# Patient Record
Sex: Male | Born: 1965 | Race: White | Hispanic: No | Marital: Single | Smoking: Never smoker
Health system: Southern US, Community
[De-identification: ages and names within clinical notes are randomized; demographics above are authoritative.]

## PROBLEM LIST (undated history)

## (undated) DIAGNOSIS — I10 Essential (primary) hypertension: Secondary | ICD-10-CM

## (undated) HISTORY — PX: APPENDECTOMY: SHX54

## (undated) HISTORY — PX: BACK SURGERY: SHX140

---

## 2004-08-09 ENCOUNTER — Encounter (INDEPENDENT_AMBULATORY_CARE_PROVIDER_SITE_OTHER): Payer: Self-pay | Admitting: *Deleted

## 2004-08-09 ENCOUNTER — Observation Stay (HOSPITAL_COMMUNITY): Admission: RE | Admit: 2004-08-09 | Discharge: 2004-08-10 | Payer: Self-pay | Admitting: Orthopedic Surgery

## 2008-10-01 ENCOUNTER — Ambulatory Visit (HOSPITAL_COMMUNITY): Admission: RE | Admit: 2008-10-01 | Discharge: 2008-10-01 | Payer: Self-pay | Admitting: Orthopedic Surgery

## 2009-02-14 ENCOUNTER — Ambulatory Visit: Payer: Self-pay | Admitting: Occupational Medicine

## 2009-02-14 DIAGNOSIS — S838X9A Sprain of other specified parts of unspecified knee, initial encounter: Secondary | ICD-10-CM | POA: Insufficient documentation

## 2009-02-14 DIAGNOSIS — S86819A Strain of other muscle(s) and tendon(s) at lower leg level, unspecified leg, initial encounter: Secondary | ICD-10-CM | POA: Insufficient documentation

## 2009-07-28 ENCOUNTER — Ambulatory Visit: Payer: Self-pay | Admitting: Family Medicine

## 2009-07-28 LAB — CONVERTED CEMR LAB: Rapid Strep: NEGATIVE

## 2010-03-28 NOTE — Assessment & Plan Note (Signed)
Summary: Possible strep throat   Vital Signs:  Patient Profile:   45 Years Old Male CC:      sore throat intermittent X 3 weeks, severe X today Height:     74 inches Weight:      268 pounds O2 Sat:      95 % O2 treatment:    Room Air Temp:     98.7 degrees F oral Pulse rate:   110 / minute Pulse rhythm:   regular Resp:     16 per minute BP sitting:   161 / 95  (right arm) Cuff size:   large  Pt. in pain?   yes    Location:   throat    Type:       sore  Vitals Entered By: Lajean Saver RN (July 28, 2009 3:05 PM)                   Updated Prior Medication List: TOPROL XL 25 MG XR24H-TAB (METOPROLOL SUCCINATE) once daily LISINOPRIL 20 MG TABS (LISINOPRIL) once daily  Current Allergies (reviewed today): ! PENICILLIN G POTASSIUM (PENICILLIN G POTASSIUM) ! CODEINE PHOSPHATE (CODEINE PHOSPHATE)History of Present Illness Chief Complaint: sore throat intermittent X 3 weeks, severe X today History of Present Illness: AS ABOVE. BOTH OF HIS CHILDREM POS FOR STREP. NO FEVER, NO RUNNY NOSE OR COUGH. NO SELF TX.   REVIEW OF SYSTEMS Constitutional Symptoms      Denies fever, chills, night sweats, weight loss, weight gain, and fatigue.  Eyes       Denies change in vision, eye pain, eye discharge, glasses, contact lenses, and eye surgery. Ear/Nose/Throat/Mouth       Complains of sore throat.      Denies hearing loss/aids, change in hearing, ear pain, ear discharge, dizziness, frequent runny nose, frequent nose bleeds, sinus problems, hoarseness, and tooth pain or bleeding.  Respiratory       Denies dry cough, productive cough, wheezing, shortness of breath, asthma, bronchitis, and emphysema/COPD.  Cardiovascular       Denies murmurs, chest pain, and tires easily with exhertion.    Gastrointestinal       Denies stomach pain, nausea/vomiting, diarrhea, constipation, blood in bowel movements, and indigestion. Genitourniary       Denies painful urination, kidney stones, and loss of  urinary control. Neurological       Denies paralysis, seizures, and fainting/blackouts. Musculoskeletal       Denies muscle pain, joint pain, joint stiffness, decreased range of motion, redness, swelling, muscle weakness, and gout.  Skin       Denies bruising, unusual mles/lumps or sores, and hair/skin or nail changes.  Psych       Denies mood changes, temper/anger issues, anxiety/stress, speech problems, depression, and sleep problems.  Past History:  Past Medical History: Reviewed history from 02/14/2009 and no changes required. htn since age 10  Past Surgical History: lumbar Sx Appendectomy  Family History: Reviewed history from 02/14/2009 and no changes required. mom-breast CA dad died of cancer-unknown age 79  Social History: Never Smoked Alcohol use-no Drug use-no Smoking Status:  never Drug Use:  no Physical Exam General appearance: well developed, well nourished, no acute distress Ears: normal, no lesions or deformities Nasal: mucosa pink, nonedematous, no septal deviation, turbinates normal Oral/Pharynx: RED AND SWOLLEN WITH WHITE EXUDATES.  Neck: neck supple,  trachea midline, no masses Chest/Lungs: no rales, wheezes, or rhonchi bilateral, breath sounds equal without effort Heart: regular rate and  rhythm, no murmur  Skin: no obvious rashes or lesions Assessment New Problems: PHARYNGITIS, ACUTE (ICD-462.0)   Plan New Medications/Changes: ERYTHROMYCIN BASE 333 MG TBEC (ERYTHROMYCIN BASE) 1 TID  ##30 x 0, 07/28/2009, Marvis Moeller DO  New Orders: Est. Patient Level III [57846]   Prescriptions: ERYTHROMYCIN BASE 333 MG TBEC (ERYTHROMYCIN BASE) 1 TID  ##30 x 0   Entered and Authorized by:   Marvis Moeller DO   Signed by:   Marvis Moeller DO on 07/28/2009   Method used:   Print then Give to Patient   RxID:   [id number]   Patient Instructions: 1)  TYLENOL OR MOTRIN AS NEEDED. AVOID CAFFEINE AND MILK PRODUCTS. THROAT SPRAY OF CHOICE. FOLLOW UP  WITH YOUR PCP OR RETURN IF SYMPTOMS PERSIST OR WORSEN.   Orders Added: 1)  Est. Patient Level III [99213]  Laboratory Results  Date/Time Received: July 28, 2009 3:33 PM  Date/Time Reported: July 28, 2009 3:33 PM   Other Tests  Rapid Strep: negative  Kit Test Internal QC: Negative   (Normal Range: Negative)

## 2010-07-14 NOTE — Op Note (Signed)
NAME:  Juan Rojas, Juan Rojas NO.:  [id number]   MEDICAL RECORD NO.:  [id number]          PATIENT TYPE:  OBV   LOCATION:  1621                         FACILITY:  [facility]   PHYSICIAN:  Georges Lynch. Gioffre, M.D.DATE OF BIRTH:  02-17-1966   DATE OF PROCEDURE:  08/09/2004  DATE OF DISCHARGE:                                 OPERATIVE REPORT   SURGEON:  Georges Lynch. Darrelyn Hillock, M.D.   ASSISTANT:  Marlowe Kays, M.D.   PREOPERATIVE DIAGNOSIS:  Large herniated disk on the left at L5-6.  He had  six lumbar vertebrae.  The herniated disk was the next-to-last open disk  space.  This was verified looking with the radiologist looking at the plain  films as well as the MRI.   POSTOPERATIVE DIAGNOSIS:  Large herniated disk on the left at L5-6.  He had  six lumbar vertebrae.  The herniated disk was the next-to-last open disk  space.  This was verified looking with the radiologist looking at the plain  films as well as the MRI.   OPERATION:  Hemilaminectomy and microdiskectomy at L5-6 on the left.   PROCEDURE:  Under general anesthesia with the patient on the spinal frame,  he first had 1 gm of IV Ancef.  Routine orthopedic prepping and draping of  the lower back was carried out.  Two needles were placed in the back for  localization purposes and an x-ray was taken.  Once we evaluated the proper  space, an incision was made over the L5-6 interspace.  Bleeders were  identified and cauterized.  The muscle was stripped away from the lamina and  spinous process in the usual fashion.  Another x-ray was taken to verify the  position.  We then inserted the [facility] retractors.  I then carried out a  hemilaminectomy in the usual fashion at L5-6.  The ligamentum flavum was  identified and removed as well.  Great care was taken not to injure the  underlying dura or nerve root.  Following this, we gently identified the  root, gently retracted the root, cauterized the lateral recess veins with a  bipolar, identified a large extruded herniated disk.  A cruciate incision  was made in the posterior longitudinal ligament.  As soon as that was done,  the disk material literally extruded like a volcano from the disk space.  We  then went down into the disk space and removed the remaining disks.  We went  subligamentous proximally, distally, medially and laterally as well to make  sure there were no other loose fragments.  There were none.  We were then  easily able to pass a hockey stick out the foramen.  The 5 root was now  free.  We thoroughly irrigated the area.  Good hemostasis was maintained.  I  loosely applied some thrombin-soaked Gelfoam to the area.  The deep part of  the wound was closed in the usual fashion except a small portion was left  open proximally just for drainage purposes.  The remaining part of the wound  was closed in the usual fashion.  The skin was closed  with metal staples.  A  sterile Neosporin dressing was applied.  The patient had 30 mg of IV Toradol  before leaving the operating room.   NOTE:  He had a partial foot drop preop on the left.     RAG/MEDQ  D:  08/09/2004  T:  08/09/2004  Job:  [id number]

## 2010-07-25 ENCOUNTER — Encounter: Payer: Self-pay | Admitting: Emergency Medicine

## 2010-07-25 ENCOUNTER — Inpatient Hospital Stay (INDEPENDENT_AMBULATORY_CARE_PROVIDER_SITE_OTHER)
Admission: RE | Admit: 2010-07-25 | Discharge: 2010-07-25 | Disposition: A | Discharge status: Home / Self Care | Payer: BC Managed Care – PPO | Source: Ambulatory Visit | Attending: Emergency Medicine | Admitting: Emergency Medicine

## 2010-07-25 DIAGNOSIS — J069 Acute upper respiratory infection, unspecified: Secondary | ICD-10-CM

## 2010-07-25 LAB — CONVERTED CEMR LAB: Rapid Strep: NEGATIVE

## 2010-07-27 ENCOUNTER — Telehealth (INDEPENDENT_AMBULATORY_CARE_PROVIDER_SITE_OTHER): Payer: Self-pay | Admitting: *Deleted

## 2011-01-29 NOTE — Progress Notes (Signed)
Summary: SORE THROAT AND HEADACHES   Vital Signs:  Patient Profile:   45 Years Old Male CC:      sore throat and HA, body aches x 3 days  Height:     74 inches Weight:      98.8 pounds O2 Sat:      97 % O2 treatment:    Room Air Temp:     98.8 degrees F oral Pulse rate:   78 / minute Resp:     16 per minute BP sitting:   130 / 80  (left arm) Cuff size:   large  Vitals Entered By: Lajean Saver RN (Jul 25, 2010 8:45 AM)                  Updated Prior Medication List: TOPROL XL 25 MG XR24H-TAB (METOPROLOL SUCCINATE) once daily LISINOPRIL 20 MG TABS (LISINOPRIL) once daily  Current Allergies (reviewed today): ! PENICILLIN G POTASSIUM (PENICILLIN G POTASSIUM) ! CODEINE PHOSPHATE (CODEINE PHOSPHATE)History of Present Illness History from: patient Chief Complaint: sore throat and HA, body aches x 3 days  History of Present Illness: 45 Years Old Male complains of onset of cold symptoms for 3 days.  Juan Rojas has been using no OTC meds. +sore throat No cough No pleuritic pain No wheezing No nasal congestion No post-nasal drainage No sinus pain/pressure No chest congestion No itchy/red eyes No earache No hemoptysis No SOB +chills/sweats No fever No nausea No vomiting No abdominal pain No diarrhea No skin rashes No fatigue No myalgias + headache   REVIEW OF SYSTEMS Constitutional Symptoms      Denies fever, chills, night sweats, weight loss, weight gain, and fatigue.      Comments: body aches Eyes       Denies change in vision, eye pain, eye discharge, glasses, contact lenses, and eye surgery. Ear/Nose/Throat/Mouth       Complains of sore throat.      Denies hearing loss/aids, change in hearing, ear pain, ear discharge, dizziness, frequent runny nose, frequent nose bleeds, sinus problems, hoarseness, and tooth pain or bleeding.  Respiratory       Denies dry cough, productive cough, wheezing, shortness of breath, asthma, bronchitis, and emphysema/COPD.    Cardiovascular       Denies murmurs, chest pain, and tires easily with exhertion.    Gastrointestinal       Denies stomach pain, nausea/vomiting, diarrhea, constipation, blood in bowel movements, and indigestion. Genitourniary       Denies painful urination, kidney stones, and loss of urinary control. Neurological       Complains of headaches.      Denies paralysis, seizures, and fainting/blackouts. Musculoskeletal       Denies muscle pain, joint pain, joint stiffness, decreased range of motion, redness, swelling, muscle weakness, and gout.  Skin       Denies bruising, unusual mles/lumps or sores, and hair/skin or nail changes.  Psych       Denies mood changes, temper/anger issues, anxiety/stress, speech problems, depression, and sleep problems. Other Comments: pt c/o sore throat, body aches and HA x 3 days   Past History:  Past Medical History: Reviewed history from 02/14/2009 and no changes required. htn since age 35  Past Surgical History: Reviewed history from 07/28/2009 and no changes required. lumbar Sx Appendectomy  Family History: Reviewed history from 02/14/2009 and no changes required. mom-breast CA dad died of cancer-unknown age 39  Social History: Never Smoked Alcohol use-yes Drug use-no Physical Exam General appearance: well  developed, well nourished, no acute distress Ears: normal, no lesions or deformities Nasal: mucosa pink, nonedematous, no septal deviation, turbinates normal Oral/Pharynx: pharyngeal erythema without exudate, uvula midline without deviation Chest/Lungs: no rales, wheezes, or rhonchi bilateral, breath sounds equal without effort Heart: regular rate and  rhythm, no murmur MSE: oriented to time, place, and person Assessment New Problems: UPPER RESPIRATORY INFECTION, ACUTE (ICD-465.9)   Plan New Medications/Changes: ZITHROMAX Z-PAK 250 MG TABS (AZITHROMYCIN) use as directed  #1 x 0, 07/25/2010, Hoyt Koch MD  New  Orders: Est. Patient Level III [29528] Rapid Strep [87880] T-Culture, Throat [[id number]] Planning Comments:   1)  Take the prescribed antibiotic as instructed.  Hold for a few days until the culture returns.  Rapid strep negative. 2)  Use nasal saline solution (over the counter) at least 3 times a day. 3)  Use over the counter decongestants like Zyrtec-D every 12 hours as needed to help with congestion. 4)  Can take tylenol every 6 hours or motrin every 8 hours for pain or fever. 5)  Follow up with your primary doctor  if no improvement in 5-7 days, sooner if increasing pain, fever, or new symptoms.    The patient and/or caregiver has been counseled thoroughly with regard to medications prescribed including dosage, schedule, interactions, rationale for use, and possible side effects and they verbalize understanding.  Diagnoses and expected course of recovery discussed and will return if not improved as expected or if the condition worsens. Patient and/or caregiver verbalized understanding.  Prescriptions: ZITHROMAX Z-PAK 250 MG TABS (AZITHROMYCIN) use as directed  #1 x 0   Entered and Authorized by:   Hoyt Koch MD   Signed by:   Hoyt Koch MD on 07/25/2010   Method used:   Print then Give to Patient   RxID:   [id number]   Orders Added: 1)  Est. Patient Level III [56387] 2)  Rapid Strep [56433] 3)  T-Culture, Throat [[id number]]    Laboratory Results  Date/Time Received: Jul 25, 2010 8:46 AM  Date/Time Reported: Jul 25, 2010 8:47 AM   Other Tests  Rapid Strep: negative  Kit Test Internal QC: Negative   (Normal Range: Negative)

## 2011-01-29 NOTE — Telephone Encounter (Signed)
  Phone Note Outgoing Call Call back at Home Phone [phone] P PH     Call placed by: Lajean Saver RN,  Jul 27, 2010 2:40 PM Call placed to: Patient Action Taken: Phone Call Completed Summary of Call: Callback: Patient not available. Spoke with wife. She reports he is better with the exception of his throat. Negative throat culture given. Wife reported he started the antibiotic last night. Advised to finish the series

## 2011-06-14 ENCOUNTER — Emergency Department (INDEPENDENT_AMBULATORY_CARE_PROVIDER_SITE_OTHER)
Admission: EM | Admit: 2011-06-14 | Discharge: 2011-06-14 | Disposition: A | Discharge status: Home / Self Care | Payer: BC Managed Care – PPO | Source: Home / Self Care | Attending: Emergency Medicine | Admitting: Emergency Medicine

## 2011-06-14 ENCOUNTER — Encounter: Payer: Self-pay | Admitting: *Deleted

## 2011-06-14 DIAGNOSIS — J029 Acute pharyngitis, unspecified: Secondary | ICD-10-CM

## 2011-06-14 HISTORY — DX: Essential (primary) hypertension: I10

## 2011-06-14 MED ORDER — CLARITHROMYCIN 500 MG PO TABS: 500.0000 mg | ORAL_TABLET | Freq: Two times a day (BID) | ORAL | Status: AC

## 2011-06-14 NOTE — ED Provider Notes (Signed)
History     CSN: [id number]  Arrival date & time 06/14/11  1147   First MD Initiated Contact with Patient 06/14/11 1214      Chief Complaint  Patient presents with  . Sore Throat    (Consider location/radiation/quality/duration/timing/severity/associated sxs/prior treatment) HPI Juan Rojas is a 46 y.o. male who complains of onset of cold symptoms for 4 weeks.  The symptoms are constant and mild in severity.  He states that his children have had sore throats and has been making him sick as well.  He states that he does get sore throats a lot but the strep test was negative, so this time he waited as long as he could but it seems to be getting worse after a month now.  He states that he has no allergic rhinitis or GERD.   + sore throat No cough No pleuritic pain No wheezing No nasal congestion No post-nasal drainage No sinus pain/pressure No chest congestion No itchy/red eyes No earache No hemoptysis No SOB No chills/sweats No fever No nausea No vomiting No abdominal pain No diarrhea No skin rashes + fatigue No myalgias No headache    Past Medical History  Diagnosis Date  . Hypertension     Past Surgical History  Procedure Date  . Appendectomy   . Back surgery     Family History  Problem Relation Age of Onset  . Cancer Mother   . Cancer Father     History  Substance Use Topics  . Smoking status: Never Smoker   . Smokeless tobacco: Not on file  . Alcohol Use: No      Review of Systems  All other systems reviewed and are negative.    Allergies  Codeine phosphate and Penicillins  Home Medications   Current Outpatient Rx  Name Route Sig Dispense Refill  . LISINOPRIL 10 MG PO TABS Oral Take 10 mg by mouth daily.    Marland Kitchen METOPROLOL SUCCINATE ER 25 MG PO TB24 Oral Take 25 mg by mouth daily.    Marland Kitchen CLARITHROMYCIN 500 MG PO TABS Oral Take 1 tablet (500 mg total) by mouth 2 (two) times daily. 14 tablet 0    BP 125/80  Pulse 84  Temp(Src) 98.6 F (37  C) (Oral)  Resp 18  Ht 6' 1.5" (1.867 m)  Wt 269 lb (122.018 kg)  BMI 35.01 kg/m2  SpO2 97%  Physical Exam  Nursing note and vitals reviewed. Constitutional: He is oriented to person, place, and time. He appears well-developed and well-nourished.  HENT:  Head: Normocephalic and atraumatic.  Right Ear: Tympanic membrane, external ear and ear canal normal.  Left Ear: Tympanic membrane, external ear and ear canal normal.  Nose: Nose normal.  Mouth/Throat: Uvula is midline. Uvula swelling present. Posterior oropharyngeal edema (Slightly enlarged uvula and tonsils 1+ bilateral, however oropharynx is widely patent) and posterior oropharyngeal erythema present. No oropharyngeal exudate.  Eyes: No scleral icterus.  Neck: Neck supple.  Cardiovascular: Regular rhythm and normal heart sounds.   Pulmonary/Chest: Effort normal and breath sounds normal. No respiratory distress.  Neurological: He is alert and oriented to person, place, and time.  Skin: Skin is warm and dry.  Psychiatric: He has a normal mood and affect. His speech is normal.    ED Course  Procedures (including critical care time)   Labs Reviewed  POCT RAPID STREP A (OFFICE)   No results found.   1. Acute pharyngitis       MDM  1)  Take the prescribed  antibiotic as instructed.  A rapid strep test was done it is negative.  No culture was performed.  He does have some swelling of his posterior pharynx there is possible uvulitis but does not appear to be causing any difficulty breathing or blocking of his throat.  I let him know that if his symptoms continue next week, then he should call back and we will call him in a prednisone pack. 2)  Use nasal saline solution (over the counter) at least 3 times a day. 3)  Use over the counter decongestants like Zyrtec-D every 12 hours as needed to help with congestion.  If you have hypertension, do not take medicines with sudafed.  4)  Can take tylenol every 6 hours or motrin every 8  hours for pain or fever. 5)  Follow up with your primary doctor if no improvement in 5-7 days, sooner if increasing pain, fever, or new symptoms.     Marlaine Hind, MD 06/14/11 1218

## 2011-06-14 NOTE — ED Notes (Signed)
Pt c/o sore throat and fatigue x 4 wks, after having a URI. Denies fever.

## 2011-11-01 ENCOUNTER — Encounter: Payer: Self-pay | Admitting: *Deleted

## 2011-11-01 ENCOUNTER — Emergency Department
Admission: EM | Admit: 2011-11-01 | Discharge: 2011-11-01 | Disposition: A | Discharge status: Home / Self Care | Payer: BC Managed Care – PPO | Source: Home / Self Care

## 2011-11-01 DIAGNOSIS — H6090 Unspecified otitis externa, unspecified ear: Secondary | ICD-10-CM

## 2011-11-01 DIAGNOSIS — H60399 Other infective otitis externa, unspecified ear: Secondary | ICD-10-CM

## 2011-11-01 DIAGNOSIS — H6690 Otitis media, unspecified, unspecified ear: Secondary | ICD-10-CM

## 2011-11-01 MED ORDER — AZITHROMYCIN 250 MG PO TABS: ORAL_TABLET | ORAL | Status: AC

## 2011-11-01 MED ORDER — NEOMYCIN-POLYMYXIN-HC 3.5-10000-1 OT SOLN: 3.0000 [drp] | Freq: Four times a day (QID) | OTIC | Status: AC

## 2011-11-01 NOTE — ED Notes (Signed)
Patient c/o left ear pain x 5 days. No drainage.

## 2011-11-01 NOTE — ED Provider Notes (Signed)
History     CSN: [id number]  Arrival date & time 11/01/11  4098   First MD Initiated Contact with Patient 11/01/11 [phone]      Chief Complaint  Patient presents with  . Otalgia    left   HPI EAR PAIN Location:  L  Description: L ear pain, discomfort, intermittent mild dizziness  Onset:  4-5 days Modifying factors: Recently came back from the beach. Has been swimming.   Symptoms  Sensation of fullness: yes Ear discharge: no URI symptoms: no  Fever: no Tinnitus:no   Dizziness:yes; mild   Hearing loss:no   Toothache: no Rashes or lesions: no Facial muscle weakness: no  Red Flags Recent trauma: no PMH prior ear surgery:  no Diabetes or Immunosuppresion: no    Past Medical History  Diagnosis Date  . Hypertension     Past Surgical History  Procedure Date  . Appendectomy   . Back surgery     Family History  Problem Relation Age of Onset  . Cancer Mother   . Cancer Father     History  Substance Use Topics  . Smoking status: Never Smoker   . Smokeless tobacco: Not on file  . Alcohol Use: No      Review of Systems  All other systems reviewed and are negative.    Allergies  Codeine phosphate and Penicillins  Home Medications   Current Outpatient Rx  Name Route Sig Dispense Refill  . AZITHROMYCIN 250 MG PO TABS  Take 2 tabs PO x 1 dose, then 1 tab PO QD x 4 days 6 tablet 0  . LISINOPRIL 10 MG PO TABS Oral Take 10 mg by mouth daily.    Marland Kitchen METOPROLOL SUCCINATE ER 25 MG PO TB24 Oral Take 25 mg by mouth daily.    . NEOMYCIN-POLYMYXIN-HC 3.5-10000-1 OT SOLN Left Ear Place 3 drops into the left ear 4 (four) times daily. 10 mL 0    BP 124/82  Pulse 81  Temp 97.9 F (36.6 C) (Oral)  Resp 14  Ht 6\' 1"  (1.854 m)  Wt 262 lb (118.842 kg)  BMI 34.57 kg/m2  SpO2 96%  Physical Exam  Constitutional: He appears well-developed and well-nourished.  HENT:  Head: Normocephalic and atraumatic.  Right Ear: External ear normal.       L ear canal erythema and  tenderness to otoscopic evaluation Mild TM bulging  Eyes: Conjunctivae are normal. Pupils are equal, round, and reactive to light.  Neck: Normal range of motion. Neck supple.  Cardiovascular: Normal rate and regular rhythm.   Pulmonary/Chest: Effort normal and breath sounds normal.  Abdominal: Soft.  Musculoskeletal: Normal range of motion.  Lymphadenopathy:    He has no cervical adenopathy.  Neurological: He is alert.  Skin: Skin is warm. No rash noted.  Psychiatric: He has a normal mood and affect.    ED Course  Procedures (including critical care time)  Labs Reviewed - No data to display No results found.   1. Otitis externa   2. Otitis media       MDM  Will treat with cortisporin otic and zpak.  Discussed infectious red flags for reevaluation.  Follow up as needed.      The patient and/or caregiver has been counseled thoroughly with regard to treatment plan and/or medications prescribed including dosage, schedule, interactions, rationale for use, and possible side effects and they verbalize understanding. Diagnoses and expected course of recovery discussed and will return if not improved as expected or if the  condition worsens. Patient and/or caregiver verbalized understanding.            Doree Albee, MD 11/01/11 [phone]

## 2012-03-02 ENCOUNTER — Emergency Department
Admission: EM | Admit: 2012-03-02 | Discharge: 2012-03-02 | Disposition: A | Discharge status: Home / Self Care | Payer: BC Managed Care – PPO | Source: Home / Self Care | Attending: Emergency Medicine | Admitting: Emergency Medicine

## 2012-03-02 DIAGNOSIS — R05 Cough: Secondary | ICD-10-CM

## 2012-03-02 DIAGNOSIS — J069 Acute upper respiratory infection, unspecified: Secondary | ICD-10-CM

## 2012-03-02 DIAGNOSIS — R059 Cough, unspecified: Secondary | ICD-10-CM

## 2012-03-02 MED ORDER — HYDROCODONE-HOMATROPINE 5-1.5 MG/5ML PO SYRP: 5.0000 mL | ORAL_SOLUTION | Freq: Four times a day (QID) | ORAL | Status: DC | PRN

## 2012-03-02 MED ORDER — AMOXICILLIN-POT CLAVULANATE 875-125 MG PO TABS: 1.0000 | ORAL_TABLET | Freq: Two times a day (BID) | ORAL | Status: DC

## 2012-03-02 NOTE — ED Provider Notes (Signed)
History     CSN: [id number]  Arrival date & time 03/02/12  1333   First MD Initiated Contact with Patient 03/02/12 1528      Chief Complaint  Patient presents with  . Cough    x 1 day  . Sore Throat    1 week ago  . Headache    few days ago    (Consider location/radiation/quality/duration/timing/severity/associated sxs/prior treatment) HPI Juan Rojas is a 47 y.o. male who complains of onset of cold symptoms for 2 weeks.  The symptoms are constant and mild-moderate in severity.  Not taking any meds OTC + sore throat + cough No pleuritic pain No wheezing No nasal congestion + post-nasal drainage No sinus pain/pressure No chest congestion No itchy/red eyes No earache No hemoptysis No SOB No chills/sweats No fever No nausea No vomiting No abdominal pain No diarrhea No skin rashes No fatigue No myalgias No headache    Past Medical History  Diagnosis Date  . Hypertension     Past Surgical History  Procedure Date  . Appendectomy   . Back surgery     Family History  Problem Relation Age of Onset  . Cancer Mother     breast  . Cancer Father     History  Substance Use Topics  . Smoking status: Never Smoker   . Smokeless tobacco: Never Used  . Alcohol Use: No      Review of Systems  All other systems reviewed and are negative.    Allergies  Codeine phosphate and Penicillins  Home Medications   Current Outpatient Rx  Name  Route  Sig  Dispense  Refill  . AMOXICILLIN-POT CLAVULANATE 875-125 MG PO TABS   Oral   Take 1 tablet by mouth 2 (two) times daily.   16 tablet   0   . HYDROCODONE-HOMATROPINE 5-1.5 MG/5ML PO SYRP   Oral   Take 5 mLs by mouth every 6 (six) hours as needed for cough.   120 mL   0   . LISINOPRIL 10 MG PO TABS   Oral   Take 10 mg by mouth daily.         Marland Kitchen METOPROLOL SUCCINATE ER 25 MG PO TB24   Oral   Take 25 mg by mouth daily.           BP 145/91  Pulse 97  Temp 99.4 F (37.4 C) (Oral)  Resp 17  Ht 6'  2" (1.88 m)  Wt 268 lb (121.564 kg)  BMI 34.41 kg/m2  SpO2 95%  Physical Exam  Nursing note and vitals reviewed. Constitutional: He is oriented to person, place, and time. He appears well-developed and well-nourished.  HENT:  Head: Normocephalic and atraumatic.  Right Ear: Tympanic membrane, external ear and ear canal normal.  Left Ear: Tympanic membrane, external ear and ear canal normal.  Nose: Mucosal edema and rhinorrhea present.  Mouth/Throat: Posterior oropharyngeal erythema present. No oropharyngeal exudate or posterior oropharyngeal edema.  Eyes: No scleral icterus.  Neck: Neck supple.  Cardiovascular: Regular rhythm and normal heart sounds.   Pulmonary/Chest: Effort normal and breath sounds normal. No respiratory distress.  Neurological: He is alert and oriented to person, place, and time.  Skin: Skin is warm and dry.  Psychiatric: He has a normal mood and affect. His speech is normal.    ED Course  Procedures (including critical care time)  Labs Reviewed - No data to display No results found.   1. Acute upper respiratory infections of unspecified site  2. Cough       MDM   1)  Take the prescribed antibiotic as instructed.  The patient's chart states that he is allergic to penicillin as well as codeine, however he states that he only got a rash from these as a small child and has not had problems with either one of since. 2)  Use nasal saline solution (over the counter) at least 3 times a day. 3)  Use over the counter decongestants like Zyrtec-D every 12 hours as needed to help with congestion.  If you have hypertension, do not take medicines with sudafed.  4)  Can take tylenol every 6 hours or motrin every 8 hours for pain or fever. 5)  Follow up with your primary doctor if no improvement in 5-7 days, sooner if increasing pain, fever, or new symptoms.        Marlaine Hind, MD 03/02/12 1540

## 2012-03-02 NOTE — ED Notes (Signed)
Ziare complains of a sore throat 1 week ago. The next day he had hoarse voice. He has had a headache for a couple of days. Last night he started coughing and wheezing. Denies fever, chills or sweats.

## 2012-08-14 ENCOUNTER — Emergency Department
Admission: EM | Admit: 2012-08-14 | Discharge: 2012-08-14 | Disposition: A | Discharge status: Home / Self Care | Payer: BC Managed Care – PPO | Source: Home / Self Care | Attending: Emergency Medicine | Admitting: Emergency Medicine

## 2012-08-14 ENCOUNTER — Encounter: Payer: Self-pay | Admitting: *Deleted

## 2012-08-14 DIAGNOSIS — J069 Acute upper respiratory infection, unspecified: Secondary | ICD-10-CM

## 2012-08-14 DIAGNOSIS — J0100 Acute maxillary sinusitis, unspecified: Secondary | ICD-10-CM

## 2012-08-14 MED ORDER — AMOXICILLIN 875 MG PO TABS: 875.0000 mg | ORAL_TABLET | Freq: Two times a day (BID) | ORAL | Status: DC

## 2012-08-14 NOTE — ED Notes (Signed)
Juan Rojas c/o 3 week of sinus drainage, congestion and sneezing. He now c/o ear pain with dizziness. Denies fever.

## 2012-08-14 NOTE — ED Provider Notes (Addendum)
History     CSN: [id number]  Arrival date & time 08/14/12  1026   First MD Initiated Contact with Patient 08/14/12 1030      Chief Complaint  Patient presents with  . Otalgia    (Consider location/radiation/quality/duration/timing/severity/associated sxs/prior treatment) HPI Juan Rojas is a 47 y.o. male who complains of onset of cold symptoms for 3 weeks.  The symptoms are constant and mild-moderate in severity.  Taking OTC cold meds.  + Sick exposure at home.  Was improving, then worsened again.   No sore throat +/- cough No pleuritic pain No wheezing + nasal congestion + post-nasal drainage ++ sinus pain/pressure No chest congestion No itchy/red eyes + bilateral earache No hemoptysis No SOB No chills/sweats No fever No nausea No vomiting No abdominal pain No diarrhea No skin rashes No fatigue No myalgias No headache    Past Medical History  Diagnosis Date  . Hypertension     Past Surgical History  Procedure Laterality Date  . Appendectomy    . Back surgery      Family History  Problem Relation Age of Onset  . Cancer Mother     breast  . Cancer Father     History  Substance Use Topics  . Smoking status: Never Smoker   . Smokeless tobacco: Never Used  . Alcohol Use: No      Review of Systems  All other systems reviewed and are negative.    Allergies  Codeine phosphate and Penicillins  Home Medications   Current Outpatient Rx  Name  Route  Sig  Dispense  Refill  . amoxicillin (AMOXIL) 875 MG tablet   Oral   Take 1 tablet (875 mg total) by mouth 2 (two) times daily.   20 tablet   0   . lisinopril (PRINIVIL,ZESTRIL) 10 MG tablet   Oral   Take 10 mg by mouth daily.         . metoprolol succinate (TOPROL-XL) 25 MG 24 hr tablet   Oral   Take 25 mg by mouth daily.           BP 120/74  Pulse 68  Temp(Src) 98.1 F (36.7 C) (Oral)  Resp 16  Wt 265 lb (120.203 kg)  BMI 34.01 kg/m2  SpO2 97%  Physical Exam  Nursing note and  vitals reviewed. Constitutional: He is oriented to person, place, and time. He appears well-developed and well-nourished.  HENT:  Head: Normocephalic and atraumatic.  Right Ear: Tympanic membrane, external ear and ear canal normal.  Left Ear: Tympanic membrane, external ear and ear canal normal.  Nose: Mucosal edema present.  Mouth/Throat: No oropharyngeal exudate, posterior oropharyngeal edema or posterior oropharyngeal erythema.  Slight clear fluid behind both TM's, no signs of infection.  Clear postnasal drip.  Eyes: No scleral icterus.  Neck: Neck supple.  Cardiovascular: Regular rhythm and normal heart sounds.   Pulmonary/Chest: Effort normal and breath sounds normal. No respiratory distress.  Neurological: He is alert and oriented to person, place, and time.  Skin: Skin is warm and dry.  Psychiatric: He has a normal mood and affect. His speech is normal.    ED Course  Procedures (including critical care time)  Labs Reviewed - No data to display No results found.   1. Acute maxillary sinusitis       MDM  1)  Take the prescribed antibiotic as instructed.  Doesn't want Zpak.  Allergic to PCN as a child, but has taken Amoxicillin with no problems. 2)  Use  nasal saline solution (over the counter) at least 3 times a day. 3)  Use over the counter decongestants like Zyrtec-D every 12 hours as needed to help with congestion.  If you have hypertension, do not take medicines with sudafed.  4)  Can take tylenol every 6 hours or motrin every 8 hours for pain or fever. 5)  Follow up with your primary doctor if no improvement in 5-7 days, sooner if increasing pain, fever, or new symptoms.   At that time, may consider prednisone dose pack.  Marlaine Hind, MD 08/14/12 1100   Despite above, pharmacy called per pt's request to get medication changed.  Changed via phone to Bactrim DS BID x10 days.  Marlaine Hind, MD 08/14/12 1308

## 2013-01-01 ENCOUNTER — Emergency Department
Admission: EM | Admit: 2013-01-01 | Discharge: 2013-01-01 | Disposition: A | Discharge status: Home / Self Care | Payer: BC Managed Care – PPO | Source: Home / Self Care | Attending: Family Medicine | Admitting: Family Medicine

## 2013-01-01 ENCOUNTER — Encounter: Payer: Self-pay | Admitting: Emergency Medicine

## 2013-01-01 DIAGNOSIS — R42 Dizziness and giddiness: Secondary | ICD-10-CM

## 2013-01-01 DIAGNOSIS — H6092 Unspecified otitis externa, left ear: Secondary | ICD-10-CM

## 2013-01-01 DIAGNOSIS — H60399 Other infective otitis externa, unspecified ear: Secondary | ICD-10-CM

## 2013-01-01 MED ORDER — AZITHROMYCIN 250 MG PO TABS: ORAL_TABLET | ORAL | Status: DC

## 2013-01-01 MED ORDER — NEOMYCIN-POLYMYXIN-HC 3.5-10000-1 OT SOLN: 3.0000 [drp] | Freq: Four times a day (QID) | OTIC | Status: AC

## 2013-01-01 MED ORDER — MECLIZINE HCL 25 MG PO TABS: 25.0000 mg | ORAL_TABLET | Freq: Three times a day (TID) | ORAL | Status: DC | PRN

## 2013-01-01 NOTE — ED Notes (Signed)
Dizziness, fatigue, intermittent left ear pain x 1 month

## 2013-01-01 NOTE — ED Provider Notes (Signed)
CSN: [id number]     Arrival date & time 01/01/13  0841 History   First MD Initiated Contact with Patient 01/01/13 [phone]     Chief Complaint  Patient presents with  . Dizziness    HPI  L sided intermittent ear pain over the last month.  Pt has had ear pain, fullness, as well as mild dizziness Has had episodes like this in the past. No recent infections per pt.  No recent swimming.  Has been dxd with ? Vertigo in the past.  Non smoker.  No recent trauma.    Past Medical History  Diagnosis Date  . Hypertension    Past Surgical History  Procedure Laterality Date  . Appendectomy    . Back surgery     Family History  Problem Relation Age of Onset  . Cancer Mother     breast  . Cancer Father    History  Substance Use Topics  . Smoking status: Never Smoker   . Smokeless tobacco: Never Used  . Alcohol Use: No    Review of Systems  All other systems reviewed and are negative.    Allergies  Codeine phosphate and Penicillins  Home Medications   Current Outpatient Rx  Name  Route  Sig  Dispense  Refill  . amoxicillin (AMOXIL) 875 MG tablet   Oral   Take 1 tablet (875 mg total) by mouth 2 (two) times daily.   20 tablet   0   . azithromycin (ZITHROMAX) 250 MG tablet      Take 2 tabs PO x 1 dose, then 1 tab PO QD x 4 days   6 tablet   0   . lisinopril (PRINIVIL,ZESTRIL) 10 MG tablet   Oral   Take 10 mg by mouth daily.         . meclizine (ANTIVERT) 25 MG tablet   Oral   Take 1 tablet (25 mg total) by mouth 3 (three) times daily as needed for dizziness.   30 tablet   0   . metoprolol succinate (TOPROL-XL) 25 MG 24 hr tablet   Oral   Take 25 mg by mouth daily.         Marland Kitchen neomycin-polymyxin-hydrocortisone (CORTISPORIN) otic solution   Left Ear   Place 3 drops into the left ear 4 (four) times daily.   10 mL   0    BP 135/88  Pulse 78  Temp(Src) 98.1 F (36.7 C) (Oral)  Ht 6\' 3"  (1.905 m)  Wt 268 lb (121.564 kg)  BMI 33.50 kg/m2  SpO2  98% Physical Exam  Constitutional: He is oriented to person, place, and time. He appears well-developed and well-nourished.  HENT:  Head: Normocephalic and atraumatic.  Right Ear: External ear normal.  L ear canal erythema and tenderness to otoscopic evaluation Mild L TM bulging    Eyes: Conjunctivae are normal. Pupils are equal, round, and reactive to light.  Neck: Normal range of motion. Neck supple.  Cardiovascular: Normal rate and regular rhythm.   Pulmonary/Chest: Effort normal and breath sounds normal.  Abdominal: Soft.  Neurological: He is alert and oriented to person, place, and time.  dix hallpike positive    Skin: Skin is warm.    ED Course  Procedures (including critical care time) Labs Review Labs Reviewed - No data to display Imaging Review No results found.  EKG Interpretation     Ventricular Rate:    PR Interval:    QRS Duration:   QT Interval:  QTC Calculation:   R Axis:     Text Interpretation:              MDM   1. OE (otitis externa), left   2. Vertigo    Will treat with meclizine, cortisporin and zpak.  Ddx also includes meniere's disease as this has been a recurrent issue in the past with some? Associated ear ringing.  Discussed with pt that he would benefit from ENT follow up as well as PCP set up to further eval sxs.  Pt expressed understanding.  Discussed ENT red flags.  Follow up as needed.      The patient and/or caregiver has been counseled thoroughly with regard to treatment plan and/or medications prescribed including dosage, schedule, interactions, rationale for use, and possible side effects and they verbalize understanding. Diagnoses and expected course of recovery discussed and will return if not improved as expected or if the condition worsens. Patient and/or caregiver verbalized understanding.         Doree Albee, MD 01/01/13 [phone]

## 2014-01-05 ENCOUNTER — Emergency Department
Admission: EM | Admit: 2014-01-05 | Discharge: 2014-01-05 | Disposition: A | Discharge status: Home / Self Care | Payer: BC Managed Care – PPO | Source: Home / Self Care | Attending: Physician Assistant | Admitting: Physician Assistant

## 2014-01-05 ENCOUNTER — Encounter: Payer: Self-pay | Admitting: *Deleted

## 2014-01-05 DIAGNOSIS — J0100 Acute maxillary sinusitis, unspecified: Secondary | ICD-10-CM

## 2014-01-05 DIAGNOSIS — H9202 Otalgia, left ear: Secondary | ICD-10-CM

## 2014-01-05 MED ORDER — LEVOFLOXACIN 500 MG PO TABS: 500.0000 mg | ORAL_TABLET | Freq: Every day | ORAL | Status: DC

## 2014-01-05 NOTE — ED Notes (Signed)
Bilateral ear pain x 4 days. Denies fever. Reports recent sinus infection lasting 1 month, treating @ home.

## 2014-01-05 NOTE — Discharge Instructions (Signed)
Consider flonase 2 sprays each nostril as well.   Sinusitis Sinusitis is redness, soreness, and inflammation of the paranasal sinuses. Paranasal sinuses are air pockets within the bones of your face (beneath the eyes, the middle of the forehead, or above the eyes). In healthy paranasal sinuses, mucus is able to drain out, and air is able to circulate through them by way of your nose. However, when your paranasal sinuses are inflamed, mucus and air can become trapped. This can allow bacteria and other germs to grow and cause infection. Sinusitis can develop quickly and last only a short time (acute) or continue over a long period (chronic). Sinusitis that lasts for more than 12 weeks is considered chronic.  CAUSES  Causes of sinusitis include:  Allergies.  Structural abnormalities, such as displacement of the cartilage that separates your nostrils (deviated septum), which can decrease the air flow through your nose and sinuses and affect sinus drainage.  Functional abnormalities, such as when the small hairs (cilia) that line your sinuses and help remove mucus do not work properly or are not present. SIGNS AND SYMPTOMS  Symptoms of acute and chronic sinusitis are the same. The primary symptoms are pain and pressure around the affected sinuses. Other symptoms include:  Upper toothache.  Earache.  Headache.  Bad breath.  Decreased sense of smell and taste.  A cough, which worsens when you are lying flat.  Fatigue.  Fever.  Thick drainage from your nose, which often is green and may contain pus (purulent).  Swelling and warmth over the affected sinuses. DIAGNOSIS  Your health care provider will perform a physical exam. During the exam, your health care provider may:  Look in your nose for signs of abnormal growths in your nostrils (nasal polyps).  Tap over the affected sinus to check for signs of infection.  View the inside of your sinuses (endoscopy) using an imaging device  that has a light attached (endoscope). If your health care provider suspects that you have chronic sinusitis, one or more of the following tests may be recommended:  Allergy tests.  Nasal culture. A sample of mucus is taken from your nose, sent to a lab, and screened for bacteria.  Nasal cytology. A sample of mucus is taken from your nose and examined by your health care provider to determine if your sinusitis is related to an allergy. TREATMENT  Most cases of acute sinusitis are related to a viral infection and will resolve on their own within 10 days. Sometimes medicines are prescribed to help relieve symptoms (pain medicine, decongestants, nasal steroid sprays, or saline sprays).  However, for sinusitis related to a bacterial infection, your health care provider will prescribe antibiotic medicines. These are medicines that will help kill the bacteria causing the infection.  Rarely, sinusitis is caused by a fungal infection. In theses cases, your health care provider will prescribe antifungal medicine. For some cases of chronic sinusitis, surgery is needed. Generally, these are cases in which sinusitis recurs more than 3 times per year, despite other treatments. HOME CARE INSTRUCTIONS   Drink plenty of water. Water helps thin the mucus so your sinuses can drain more easily.  Use a humidifier.  Inhale steam 3 to 4 times a day (for example, sit in the bathroom with the shower running).  Apply a warm, moist washcloth to your face 3 to 4 times a day, or as directed by your health care provider.  Use saline nasal sprays to help moisten and clean your sinuses.  Take  medicines only as directed by your health care provider.  If you were prescribed either an antibiotic or antifungal medicine, finish it all even if you start to feel better. SEEK IMMEDIATE MEDICAL CARE IF:  You have increasing pain or severe headaches.  You have nausea, vomiting, or drowsiness.  You have swelling around your  face.  You have vision problems.  You have a stiff neck.  You have difficulty breathing. MAKE SURE YOU:   Understand these instructions.  Will watch your condition.  Will get help right away if you are not doing well or get worse. Document Released: 02/12/2005 Document Revised: 06/29/2013 Document Reviewed: 02/27/2011 [facility] Patient Information 2015 [place], [place]. This information is not intended to replace advice given to you by your health care provider. Make sure you discuss any questions you have with your health care provider.

## 2014-01-05 NOTE — ED Provider Notes (Signed)
CSN: [id number]     Arrival date & time 01/05/14  1422 History   First MD Initiated Contact with Patient 01/05/14 1448     Chief Complaint  Patient presents with  . Otalgia   (Consider location/radiation/quality/duration/timing/severity/associated sxs/prior Treatment) HPI  Pt is a 48 yo male with 4 days of bilateral ear discomfort but only left ear pain. He feels like he has had sinus infection for last month and tried to doctor it himself with sinus rinses. Not taking any OtC medications. Denies fever, ST or cough. Hx of sinus infections. Hx of some antibiotics not treating.   Past Medical History  Diagnosis Date  . Hypertension    Past Surgical History  Procedure Laterality Date  . Appendectomy    . Back surgery     Family History  Problem Relation Age of Onset  . Cancer Mother     breast  . Cancer Father    History  Substance Use Topics  . Smoking status: Never Smoker   . Smokeless tobacco: Never Used  . Alcohol Use: No    Review of Systems  All other systems reviewed and are negative.   Allergies  Codeine phosphate and Penicillins  Home Medications   Prior to Admission medications   Medication Sig Start Date End Date Taking? Authorizing Provider  levofloxacin (LEVAQUIN) 500 MG tablet Take 1 tablet (500 mg total) by mouth daily. For 10 days. 01/05/14   Peyten Weare L Shemiah Rosch, PA-C  lisinopril (PRINIVIL,ZESTRIL) 10 MG tablet Take 10 mg by mouth daily.    Historical Provider, MD  meclizine (ANTIVERT) 25 MG tablet Take 1 tablet (25 mg total) by mouth 3 (three) times daily as needed for dizziness. 01/01/13   Doree AlbeeSteven Newton, MD  metoprolol succinate (TOPROL-XL) 25 MG 24 hr tablet Take 25 mg by mouth daily.    Historical Provider, MD   BP 131/78 mmHg  Pulse 93  Temp(Src) 98.7 F (37.1 C) (Oral)  Resp 14  Wt 270 lb (122.471 kg)  SpO2 98% Physical Exam  Constitutional: He is oriented to person, place, and time. He appears well-developed and well-nourished.  HENT:  Head:  Normocephalic and atraumatic.  Right Ear: External ear normal.  Left Ear: External ear normal.  Mouth/Throat: Oropharynx is clear and moist. No oropharyngeal exudate.  External canals look great. TM of left slightly bulging but no dullness. Good light reflex. Some erythema surrounding left TM.   Maxillary and frontal sinus tenderness to palpation bilaterally.   Bilateral nasal turbinates red and swollen.   Eyes: Conjunctivae are normal. Right eye exhibits no discharge. Left eye exhibits no discharge.  Neck: Normal range of motion. Neck supple.  Cardiovascular: Normal rate, regular rhythm and normal heart sounds.   Pulmonary/Chest: Effort normal and breath sounds normal. He has no wheezes.  Lymphadenopathy:    He has no cervical adenopathy.  Neurological: He is alert and oriented to person, place, and time.  Skin: Skin is dry.  Psychiatric: He has a normal mood and affect. His behavior is normal.    ED Course  Procedures (including critical care time) Labs Review Labs Reviewed - No data to display  Imaging Review No results found.   MDM   1. Acute maxillary sinusitis, recurrence not specified   2. Left ear pain    Allergic to PCN. Per pt zpak does not work and tried Levaquin last sinus infection and did great.  Treated with levaquin.  Encouraged use of flonase 2 sprays each nostril as well.  HO  given.  Follow up if not improving or if symptoms worsening.     Jomarie LongsJade L Allan Bacigalupi, PA-C 01/05/14 1556

## 2015-01-31 ENCOUNTER — Emergency Department
Admission: EM | Admit: 2015-01-31 | Discharge: 2015-01-31 | Disposition: A | Discharge status: Home / Self Care | Payer: BLUE CROSS/BLUE SHIELD | Source: Home / Self Care

## 2015-01-31 ENCOUNTER — Encounter: Payer: Self-pay | Admitting: *Deleted

## 2015-01-31 DIAGNOSIS — J019 Acute sinusitis, unspecified: Secondary | ICD-10-CM

## 2015-01-31 DIAGNOSIS — H9202 Otalgia, left ear: Secondary | ICD-10-CM | POA: Diagnosis not present

## 2015-01-31 MED ORDER — FLUTICASONE PROPIONATE 50 MCG/ACT NA SUSP: 2.0000 | Freq: Every day | NASAL | Status: DC

## 2015-01-31 MED ORDER — DOXYCYCLINE HYCLATE 100 MG PO CAPS: 100.0000 mg | ORAL_CAPSULE | Freq: Two times a day (BID) | ORAL | Status: DC

## 2015-01-31 NOTE — ED Notes (Signed)
Pt c/o LT ear pain, a little dizziness, fatigue, and drainage x 2 wks. Denies fever.

## 2015-01-31 NOTE — Discharge Instructions (Signed)

## 2015-01-31 NOTE — ED Provider Notes (Signed)
CSN: [id number]     Arrival date & time 01/31/15  1601 History   None    Chief Complaint  Patient presents with  . Otalgia   (Consider location/radiation/quality/duration/timing/severity/associated sxs/prior Treatment) HPI  Pt is a 49yo male presenting to [facility] with c/o Left ear pain that is aching and throbbing, worsening over the last 2-3 days but he has had nasal congestion, fatigue, sinus pressure, rhinorrhea, and mild intermittent dizziness for 2 weeks. Denies fever, chills, n/v/d. He has not tried anything for symptoms. No sick contacts or recent travel.   Past Medical History  Diagnosis Date  . Hypertension    Past Surgical History  Procedure Laterality Date  . Appendectomy    . Back surgery     Family History  Problem Relation Age of Onset  . Cancer Mother     breast  . Cancer Father    Social History  Substance Use Topics  . Smoking status: Never Smoker   . Smokeless tobacco: Never Used  . Alcohol Use: No    Review of Systems  Constitutional: Negative for fever and chills.  HENT: Positive for congestion, ear pain (Left) and sinus pressure. Negative for sore throat, trouble swallowing and voice change.   Respiratory: Positive for cough. Negative for shortness of breath.   Cardiovascular: Negative for chest pain and palpitations.  Gastrointestinal: Negative for nausea, vomiting, abdominal pain and diarrhea.  Musculoskeletal: Negative for myalgias, back pain and arthralgias.  Skin: Negative for rash.  Neurological: Positive for dizziness and headaches. Negative for light-headedness.  All other systems reviewed and are negative.   Allergies  Codeine phosphate and Penicillins  Home Medications   Prior to Admission medications   Medication Sig Start Date End Date Taking? Authorizing Provider  lisinopril (PRINIVIL,ZESTRIL) 10 MG tablet Take 10 mg by mouth daily.   Yes Historical Provider, MD  metoprolol succinate (TOPROL-XL) 25 MG 24 hr tablet Take 25 mg by mouth  daily.   Yes Historical Provider, MD  doxycycline (VIBRAMYCIN) 100 MG capsule Take 1 capsule (100 mg total) by mouth 2 (two) times daily. One po bid x 10 days 01/31/15   Junius Finner, PA-C  fluticasone [facility]) 50 MCG/ACT nasal spray Place 2 sprays into both nostrils daily. For at least 2 weeks, or seasonally 01/31/15   Junius Finner, PA-C   Meds Ordered and Administered this Visit  Medications - No data to display  BP 142/91 mmHg  Pulse 83  Temp(Src) 98.3 F (36.8 C) (Oral)  Resp 18  Ht  (1.88 m)  Wt 265 lb (120.203 kg)  BMI 34.01 kg/m2  SpO2 98% No data found.   Physical Exam  Constitutional: He is oriented to person, place, and time. He appears well-developed and well-nourished.  HENT:  Head: Normocephalic and atraumatic.  Right Ear: Hearing, tympanic membrane, external ear and ear canal normal.  Left Ear: Hearing, external ear and ear canal normal. A middle ear effusion is present.  Nose: Mucosal edema present. Right sinus exhibits no maxillary sinus tenderness and no frontal sinus tenderness. Left sinus exhibits maxillary sinus tenderness. Left sinus exhibits no frontal sinus tenderness.  Mouth/Throat: Uvula is midline and mucous membranes are normal. Posterior oropharyngeal erythema present. No oropharyngeal exudate, posterior oropharyngeal edema or tonsillar abscesses.  Eyes: Conjunctivae and EOM are normal. Pupils are equal, round, and reactive to light. No scleral icterus.  Neck: Normal range of motion. Neck supple.  Cardiovascular: Normal rate, regular rhythm and normal heart sounds.   Pulmonary/Chest: Effort normal and  breath sounds normal. No respiratory distress. He has no wheezes. He has no rales. He exhibits no tenderness.  Abdominal: Soft. He exhibits no distension and no mass. There is no tenderness. There is no rebound and no guarding.  Musculoskeletal: Normal range of motion.  Neurological: He is alert and oriented to person, place, and time.  Skin: Skin is  warm and dry.  Nursing note and vitals reviewed.   ED Course  Procedures (including critical care time)  Labs Review Labs Reviewed - No data to display  Imaging Review No results found.    MDM   1. Acute rhinosinusitis   2. Otalgia, left    Pt c/o 2 weeks of sinus symptoms, gradually worsening.  Hx and exam c/w acute rhino sinusitis.    Rx: doxycycline and Flonase as pt is allergic to PCN  Advised pt to use acetaminophen and ibuprofen as needed for fever and pain. Encouraged rest and fluids. F/u with PCP in 7-10 days if not improving, sooner if worsening. Pt verbalized understanding and agreement with tx plan.     Junius FinnerErin O'Malley, PA-C 01/31/15 [phone]

## 2018-02-03 ENCOUNTER — Encounter: Payer: Self-pay | Admitting: Emergency Medicine

## 2018-02-03 ENCOUNTER — Emergency Department (INDEPENDENT_AMBULATORY_CARE_PROVIDER_SITE_OTHER)
Admission: EM | Admit: 2018-02-03 | Discharge: 2018-02-03 | Disposition: A | Discharge status: Home / Self Care | Payer: BLUE CROSS/BLUE SHIELD | Source: Home / Self Care

## 2018-02-03 DIAGNOSIS — Z202 Contact with and (suspected) exposure to infections with a predominantly sexual mode of transmission: Secondary | ICD-10-CM

## 2018-02-03 DIAGNOSIS — M545 Low back pain, unspecified: Secondary | ICD-10-CM

## 2018-02-03 DIAGNOSIS — R300 Dysuria: Secondary | ICD-10-CM | POA: Diagnosis not present

## 2018-02-03 LAB — POCT URINALYSIS DIP (MANUAL ENTRY)
Bilirubin, UA: NEGATIVE
GLUCOSE UA: NEGATIVE mg/dL
Ketones, POC UA: NEGATIVE mg/dL
Leukocytes, UA: NEGATIVE
Nitrite, UA: NEGATIVE
PROTEIN UA: NEGATIVE mg/dL
RBC UA: NEGATIVE
SPEC GRAV UA: 1.02 (ref 1.010–1.025)
UROBILINOGEN UA: 0.2 U/dL
pH, UA: 6.5 (ref 5.0–8.0)

## 2018-02-03 NOTE — ED Provider Notes (Signed)
[facility]    CSN: [id number] Arrival date & time: 02/03/18  1015     History   Chief Complaint Chief Complaint  Patient presents with  . Flank Pain    HPI Juan Rojas is a 52 y.o. male.   HPI A couple of weeks ago the patient started having a little pain in his left flank.  He has then developed mild dysuria.  No real urinary frequency.  No discharge.  No fever or chills.  No major malaise.  He works as a Corporate investment banker.  Has had lumbar disc surgery some years ago.  He is divorced, has a girlfriend who he is infrequently sexually involved with, has been for several months, and thinks she is faithful to him.  No history of STDs. Past Medical History:  Diagnosis Date  . Hypertension     Patient Active Problem List   Diagnosis Date Noted  . MUSCLE STRAIN, LEFT CALF 02/14/2009    Past Surgical History:  Procedure Laterality Date  . APPENDECTOMY    . BACK SURGERY         Home Medications    Prior to Admission medications   Medication Sig Start Date End Date Taking? Authorizing Provider  doxycycline (VIBRAMYCIN) 100 MG capsule Take 1 capsule (100 mg total) by mouth 2 (two) times daily. One po bid x 10 days 01/31/15   Lurene Shadow, PA-C  fluticasone [facility]) 50 MCG/ACT nasal spray Place 2 sprays into both nostrils daily. For at least 2 weeks, or seasonally 01/31/15   Lurene Shadow, PA-C  lisinopril (PRINIVIL,ZESTRIL) 10 MG tablet Take 10 mg by mouth daily.    [provider]  metoprolol succinate (TOPROL-XL) 25 MG 24 hr tablet Take 25 mg by mouth daily.    [provider]    Family History Family History  Problem Relation Age of Onset  . Cancer Father   . Cancer Mother        breast    Social History Social History   Tobacco Use  . Smoking status: Never Smoker  . Smokeless tobacco: Never Used  Substance Use Topics  . Alcohol use: No  . Drug use: No     Allergies   Codeine phosphate and  Penicillins   Review of Systems Review of Systems   Physical Exam Triage Vital Signs ED Triage Vitals  Enc Vitals Group     BP      Pulse      Resp      Temp      Temp src      SpO2      Weight      Height      Head Circumference      Peak Flow      Pain Score      Pain Loc      Pain Edu?      Excl. in [place]?    No data found.  Updated Vital Signs BP 132/85 (BP Location: Right Arm)   Pulse 64   Temp 98 F (36.7 C) (Oral)   Wt 108.9 kg   SpO2 96%   BMI 30.81 kg/m   Visual Acuity Right Eye Distance:   Left Eye Distance:   Bilateral Distance:    Right Eye Near:   Left Eye Near:    Bilateral Near:     Physical Exam Healthy-appearing male.  Good range of motion of the spine.  No CVA tenderness.  Abdomen soft without  masses, mild nonspecific right mid abdominal tenderness.  Normal external genitalia.  Penis appears normal.  No discharge.  No hernias.  UC Treatments / Results  Labs (all labs ordered are listed, but only abnormal results are displayed) Labs Reviewed  C. TRACHOMATIS/N. GONORRHOEAE RNA  HIV ANTIBODY (ROUTINE TESTING W REFLEX)  RPR  HSV(HERPES SIMPLEX VRS) I + II AB-IGG  POCT URINALYSIS DIP (MANUAL ENTRY)   Results for orders placed or performed during the hospital encounter of 02/03/18  POCT urinalysis dipstick (new)  Result Value Ref Range   Color, UA yellow yellow   Clarity, UA clear clear   Glucose, UA negative negative mg/dL   Bilirubin, UA negative negative   Ketones, POC UA negative negative mg/dL   Spec Grav, UA [id number] - 1.025   Blood, UA negative negative   pH, UA 6.5 5.0 - 8.0   Protein Ur, POC negative negative mg/dL   Urobilinogen, UA 0.2 0.2 or 1.0 E.U./dL   Nitrite, UA Negative Negative   Leukocytes, UA Negative Negative    EKG None  Radiology No results found.  Procedures Procedures (including critical care time)  Medications Ordered in UC Medications - No data to display  Initial Impression / Assessment  and Plan / UC Course  I have reviewed the triage vital signs and the nursing notes.  Pertinent labs & imaging results that were available during my care of the patient were reviewed by me and considered in my medical decision making (see chart for details).     Nonspecific dysuria and low back pain Final Clinical Impressions(s) / UC Diagnoses   Final diagnoses:  Dysuria  Low back pain without sciatica, unspecified back pain laterality, unspecified chronicity  Potential exposure to STD     Discharge Instructions     Drink plenty of fluids  Take Tylenol (acetaminophen) 500 mg 2 pills 3 times daily or ibuprofen (Advil) 200 mg 2 pills 3 times daily  Return if not improving.  Labs are pending, and you should receive the results on them in the next 2 or 3 days or before the end of the week.    ED Prescriptions    None     Controlled Substance Prescriptions [place] Controlled Substance Registry consulted? No   Peyton NajjarHopper, David H, MD 02/03/18 1124

## 2018-02-03 NOTE — ED Triage Notes (Signed)
Pt c/o left flank pain and some dysuria x2 weeks.

## 2018-02-03 NOTE — Discharge Instructions (Addendum)
Drink plenty of fluids  Take Tylenol (acetaminophen) 500 mg 2 pills 3 times daily or ibuprofen (Advil) 200 mg 2 pills 3 times daily  Return if not improving.  Labs are pending, and you should receive the results on them in the next 2 or 3 days or before the end of the week.

## 2018-02-04 ENCOUNTER — Telehealth: Payer: Self-pay

## 2018-02-04 LAB — C. TRACHOMATIS/N. GONORRHOEAE RNA
C. trachomatis RNA, TMA: NOT DETECTED
N. GONORRHOEAE RNA, TMA: NOT DETECTED

## 2018-02-04 LAB — HIV ANTIBODY (ROUTINE TESTING W REFLEX): HIV 1&2 Ab, 4th Generation: NONREACTIVE

## 2018-02-04 LAB — RPR: RPR Ser Ql: NONREACTIVE

## 2018-02-04 LAB — HSV(HERPES SIMPLEX VRS) I + II AB-IGG
HAV 1 IGG,TYPE SPECIFIC AB: 26.9 index — ABNORMAL HIGH
HSV 2 IGG,TYPE SPECIFIC AB: <0.9 index

## 2018-02-04 NOTE — Telephone Encounter (Signed)
Spoke with patient, gave lab results.  Will follow up as needed. 

## 2019-08-18 ENCOUNTER — Emergency Department (INDEPENDENT_AMBULATORY_CARE_PROVIDER_SITE_OTHER): Payer: BLUE CROSS/BLUE SHIELD

## 2019-08-18 ENCOUNTER — Emergency Department
Admission: EM | Admit: 2019-08-18 | Discharge: 2019-08-18 | Disposition: A | Discharge status: Home / Self Care | Payer: BLUE CROSS/BLUE SHIELD | Source: Home / Self Care | Attending: Family Medicine | Admitting: Family Medicine

## 2019-08-18 ENCOUNTER — Other Ambulatory Visit: Payer: Self-pay

## 2019-08-18 DIAGNOSIS — H9312 Tinnitus, left ear: Secondary | ICD-10-CM | POA: Diagnosis not present

## 2019-08-18 DIAGNOSIS — G501 Atypical facial pain: Secondary | ICD-10-CM | POA: Diagnosis not present

## 2019-08-18 DIAGNOSIS — R519 Headache, unspecified: Secondary | ICD-10-CM | POA: Diagnosis not present

## 2019-08-18 DIAGNOSIS — R5383 Other fatigue: Secondary | ICD-10-CM | POA: Diagnosis not present

## 2019-08-18 NOTE — ED Triage Notes (Signed)
Pt states that he has been having some ringing of the ears and left ear pain. Pt states that he has been feeling fatigue. Pt states that he has been having these symptoms for 1 month.

## 2019-08-18 NOTE — ED Provider Notes (Signed)
[facility]    CSN: [id number] Arrival date & time: 08/18/19  1120      History   Chief Complaint Chief Complaint  Patient presents with  . Otalgia    HPI Juan Rojas is a 54 y.o. male.   Patient complains of pain in his left ear and temple for about one month.  He has had intermittent ringing in his left ear and intermittently dizzy.  He feels fatigued and has noticed a subtle decrease in his vision.  During the past two weeks he has felt hot, without known fever.  He feels pressure over his anterior face and wonders if he has a sinus infection.  He recently finished a six day course of doxycycline without improvement.   The history is provided by the patient.    Past Medical History:  Diagnosis Date  . Hypertension     Patient Active Problem List   Diagnosis Date Noted  . MUSCLE STRAIN, LEFT CALF 02/14/2009    Past Surgical History:  Procedure Laterality Date  . APPENDECTOMY    . BACK SURGERY         Home Medications    Prior to Admission medications   Medication Sig Start Date End Date Taking? Authorizing Provider  doxycycline (VIBRAMYCIN) 100 MG capsule Take 1 capsule (100 mg total) by mouth 2 (two) times daily. One po bid x 10 days 01/31/15  Yes Phelps, Erin O, PA-C  fluticasone (FLONASE) 50 MCG/ACT nasal spray Place 2 sprays into both nostrils daily. For at least 2 weeks, or seasonally 01/31/15  Yes Phelps, Erin O, PA-C  lisinopril (PRINIVIL,ZESTRIL) 10 MG tablet Take 10 mg by mouth daily.   Yes [provider]  metoprolol succinate (TOPROL-XL) 25 MG 24 hr tablet Take 25 mg by mouth daily.   Yes [provider]    Family History Family History  Problem Relation Age of Onset  . Cancer Father   . Cancer Mother        breast    Social History Social History   Tobacco Use  . Smoking status: Never Smoker  . Smokeless tobacco: Never Used  Substance Use Topics  . Alcohol use: No  . Drug use: No     Allergies     Codeine phosphate and Penicillins   Review of Systems Review of Systems  Constitutional: Positive for fatigue. Negative for activity change, appetite change, chills, diaphoresis, fever and unexpected weight change.  HENT: Positive for ear pain, sinus pressure, sinus pain and tinnitus. Negative for congestion, ear discharge, facial swelling, sneezing and sore throat.   Eyes: Negative.   Respiratory: Negative.   Cardiovascular: Negative.   Gastrointestinal: Negative.   Genitourinary: Negative.   Musculoskeletal: Negative.   Skin: Negative.   Neurological: Positive for dizziness. Negative for syncope, facial asymmetry and headaches.     Physical Exam Triage Vital Signs ED Triage Vitals  Enc Vitals Group     BP 08/18/19 1138 (!) (P) 143/82     Pulse Rate 08/18/19 1138 (P) 71     Resp --      Temp 08/18/19 1138 (P) 98.8 F (37.1 C)     Temp Source 08/18/19 1138 (P) Oral     SpO2 08/18/19 1138 (P) 96 %     Weight 08/18/19 1133 245 lb (111.1 kg)     Height 08/18/19 1133 6\' 1"  (1.854 m)     Head Circumference --      Peak Flow --  Pain Score 08/18/19 1133 0     Pain Loc --      Pain Edu? --      Excl. in [place]? --    No data found.  Updated Vital Signs BP (!) (P) 143/82 (BP Location: Right Arm)   Pulse (P) 71   Temp (P) 98.8 F (37.1 C) (Oral)   Ht 6\' 1"  (1.854 m)   Wt 111.1 kg   SpO2 (P) 96%   BMI 32.32 kg/m   Visual Acuity Right Eye Distance:   Left Eye Distance:   Bilateral Distance:    Right Eye Near:   Left Eye Near:    Bilateral Near:     Physical Exam Vitals and nursing note reviewed.  Constitutional:      General: He is not in acute distress. HENT:     Head: Normocephalic.     Jaw: No tenderness or pain on movement.      Comments: There is distinct tenderness to palpation over the left temporal artery.    Right Ear: Tympanic membrane, ear canal and external ear normal. There is no impacted cerumen.     Left Ear: Tympanic membrane, ear canal and  external ear normal. There is no impacted cerumen.     Nose: Nose normal. No congestion or rhinorrhea.     Mouth/Throat:     Pharynx: Oropharynx is clear.  Eyes:     Extraocular Movements: Extraocular movements intact.     Conjunctiva/sclera: Conjunctivae normal.     Pupils: Pupils are equal, round, and reactive to light.  Cardiovascular:     Rate and Rhythm: Normal rate and regular rhythm.     Heart sounds: Normal heart sounds.  Pulmonary:     Breath sounds: Normal breath sounds.  Abdominal:     Palpations: Abdomen is soft.     Tenderness: There is no abdominal tenderness.  Musculoskeletal:     Cervical back: No tenderness.     Right lower leg: No edema.     Left lower leg: No edema.  Lymphadenopathy:     Cervical: No cervical adenopathy.  Skin:    General: Skin is warm and dry.     Findings: No rash.  Neurological:     Mental Status: He is alert.      UC Treatments / Results  Labs (all labs ordered are listed, but only abnormal results are displayed) Tympanometry:  Right ear tympanogram normal; Left ear tympanogram normal CBC:  WBC: 6.6; LY 48.9; MO 4.3; GR 46.8; Hgb 13.2; Platelets 204   EKG   Radiology DG Sinuses Complete  Result Date: 08/18/2019 CLINICAL DATA:  Facial pain for 1 month, no known injury, initial encounter EXAM: PARANASAL SINUSES - COMPLETE 3 + VIEW COMPARISON:  10/01/2008 FINDINGS: Paranasal, ethmoid, sphenoid and frontal sinuses are well aerated. No mucosal abnormality is seen. IMPRESSION: No acute abnormality noted. Electronically Signed   By: Inez Catalina M.D.   On: 08/18/2019 13:32    Procedures Procedures (including critical care time)  Medications Ordered in UC Medications - No data to display  Initial Impression / Assessment and Plan / UC Course  I have reviewed the triage vital signs and the nursing notes.  Pertinent labs & imaging results that were available during my care of the patient were reviewed by me and considered in my  medical decision making (see chart for details).    Normal CBC reassuring With history of fatigue, vision changes, and tenderness over left temporal artery, concern for possible  temporal arteritis. Sed rate, CRP, TSH pending. Followup with PCP for further evaluation of fatigue.  Final Clinical Impressions(s) / UC Diagnoses   Final diagnoses:  Fatigue, unspecified type  Facial pain  Tinnitus of left ear   Discharge Instructions   None    ED Prescriptions    None        Lattie Haw, MD 08/22/19 [phone]

## 2019-08-19 LAB — SEDIMENTATION RATE: Sed Rate: 9 mm/h (ref 0–20)

## 2019-08-19 LAB — C-REACTIVE PROTEIN: CRP: 2.4 mg/L (ref <8.0)

## 2019-08-19 LAB — TSH: TSH: 0.94 mIU/L (ref 0.40–4.50)

## 2019-08-25 LAB — POCT CBC W AUTO DIFF (K'VILLE URGENT CARE)

## 2019-11-18 ENCOUNTER — Emergency Department (HOSPITAL_BASED_OUTPATIENT_CLINIC_OR_DEPARTMENT_OTHER): Payer: BLUE CROSS/BLUE SHIELD

## 2019-11-18 ENCOUNTER — Other Ambulatory Visit: Payer: Self-pay

## 2019-11-18 ENCOUNTER — Emergency Department (INDEPENDENT_AMBULATORY_CARE_PROVIDER_SITE_OTHER): Payer: BLUE CROSS/BLUE SHIELD

## 2019-11-18 ENCOUNTER — Emergency Department
Admission: EM | Admit: 2019-11-18 | Discharge: 2019-11-18 | Disposition: A | Discharge status: Home / Self Care | Payer: BLUE CROSS/BLUE SHIELD | Source: Home / Self Care | Attending: Family Medicine | Admitting: Family Medicine

## 2019-11-18 DIAGNOSIS — R102 Pelvic and perineal pain: Secondary | ICD-10-CM

## 2019-11-18 DIAGNOSIS — S76211A Strain of adductor muscle, fascia and tendon of right thigh, initial encounter: Secondary | ICD-10-CM

## 2019-11-18 DIAGNOSIS — S76011A Strain of muscle, fascia and tendon of right hip, initial encounter: Secondary | ICD-10-CM
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
## 2019-11-18 NOTE — Discharge Instructions (Addendum)
May take Ibuprofen 200mg, 4 tabs every 8 hours with food.  °

## 2019-11-24 ENCOUNTER — Encounter: Payer: BLUE CROSS/BLUE SHIELD | Admitting: Sports Medicine

## 2019-12-07 ENCOUNTER — Ambulatory Visit (INDEPENDENT_AMBULATORY_CARE_PROVIDER_SITE_OTHER): Payer: BLUE CROSS/BLUE SHIELD

## 2019-12-07 ENCOUNTER — Ambulatory Visit: Payer: Self-pay

## 2019-12-07 ENCOUNTER — Ambulatory Visit (INDEPENDENT_AMBULATORY_CARE_PROVIDER_SITE_OTHER): Payer: BLUE CROSS/BLUE SHIELD | Admitting: Sports Medicine

## 2019-12-07 ENCOUNTER — Encounter: Payer: Self-pay | Admitting: Sports Medicine

## 2019-12-07 ENCOUNTER — Other Ambulatory Visit: Payer: Self-pay

## 2019-12-07 DIAGNOSIS — M25551 Pain in right hip: Secondary | ICD-10-CM | POA: Diagnosis not present

## 2019-12-07 DIAGNOSIS — S73191A Other sprain of right hip, initial encounter: Secondary | ICD-10-CM

## 2019-12-07 MED ORDER — GADOBUTROL 1 MMOL/ML IV SOLN
1.0000 mL | Freq: Once | INTRAVENOUS | Status: AC | PRN
  Administered 2019-12-07: 1 mL via INTRAVENOUS

## 2019-12-07 MED ORDER — NAPROXEN 500 MG PO TABS: 500.0000 mg | ORAL_TABLET | Freq: Two times a day (BID) | ORAL | 3 refills | Status: AC
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
## 2019-12-10 NOTE — Progress Notes (Signed)
1 month follow up has been scheduled. AM

## 2020-01-06 ENCOUNTER — Ambulatory Visit (INDEPENDENT_AMBULATORY_CARE_PROVIDER_SITE_OTHER): Payer: BLUE CROSS/BLUE SHIELD | Admitting: Sports Medicine

## 2020-01-06 DIAGNOSIS — S73191D Other sprain of right hip, subsequent encounter: Secondary | ICD-10-CM

## 2020-01-06 DIAGNOSIS — I861 Scrotal varices: Secondary | ICD-10-CM | POA: Diagnosis not present

## 2020-01-06 NOTE — Patient Instructions (Signed)
Varicocele  A varicocele is a swelling of veins in the scrotum. The scrotum is the sac that contains the testicles. Varicoceles can occur on either side of the scrotum, but they are more common on the left side. They occur most often in teenage boys and young men. In most cases, varicoceles are not a serious problem. They are usually small and painless and do not require treatment. Tests may be done to confirm the diagnosis. Treatment may be needed if:  A varicocele is large, causes a lot of pain, or causes pain when exercising.  Varicoceles are found on both sides of the scrotum.  A varicocele causes a decrease in the size of the testicle in a growing adolescent.  The person has fertility problems. What are the causes? This condition is the result of valves in the veins not working properly. Valves in the veins help to return blood from the scrotum and testicles to the heart. If these valves do not work well, blood flows backward and backs up into the veins, which causes the veins to swell. This is similar to what happens when varicose veins form in the leg. What are the signs or symptoms? Most varicoceles do not cause any symptoms. If symptoms do occur, they may include:  Swelling on one side of the scrotum. The swelling may be more obvious when you are standing up.  A lumpy feeling in the scrotum.  A heavy feeling on one side of the scrotum.  A dull ache in the scrotum, especially after exercise or prolonged standing or sitting.  Slower growth or reduced size of the testicle on the side of the varicocele (in young males).  Problems with fathering a child (fertility). This can occur if the testicle does not grow normally or if the condition causes problems with the sperm, such as a low sperm count or sperm that are not able to reach the egg (poor motility). How is this diagnosed? This condition is diagnosed based on:  Your medical history.  A physical exam. Your health care  provider may inspect and feel (palpate) the scrotal area to check for swollen or enlarged veins.  An ultrasound. This may be done to confirm the diagnosis and to help rule out other causes of the swelling. How is this treated? Treatment is usually not needed for this condition. If you have any pain, your health care provider may prescribe or recommend medicine to help relieve it. You may need regular exams so your health care provider can monitor the varicocele to ensure that it does not cause problems. When further treatment is needed, it may involve one of these options:  Varicocelectomy. This is a surgery in which the swollen veins are tied off so that the flow of blood goes to other veins instead.  Embolization. In this procedure, a small, thin tube (catheter) is used to place metal coils or other blocking items in the veins. This cuts off the blood flow to the swollen veins. Follow these instructions at home:  Take over-the-counter and prescription medicines only as told by your health care provider.  Wear supportive underwear.  Use an athletic supporter when participating in sports activities.  Keep all follow-up visits as told by your health care provider. This is important. Contact a health care provider if:  Your pain is increasing.  You have redness in the affected area.  Your testicle becomes enlarged, swollen, or painful.  You have swelling that does not decrease when you are lying down.    One of your testicles is smaller than the other. Get help right away if:  You develop swelling in your legs.  You have difficulty breathing. Summary  Varicocele is a condition in which the veins in the scrotum are swollen or enlarged.  In most cases, varicoceles do not require treatment.  Treatment may be needed if you have pain, have problems with infertility, or have a smaller testicle associated with the varicocele.  In some cases, the condition may be treated with a  procedure to cut off the flow of blood to the swollen veins. This information is not intended to replace advice given to you by your health care provider. Make sure you discuss any questions you have with your health care provider. Document Revised: 05/02/2018 Document Reviewed: 05/02/2018 Elsevier Patient Education  2020 [employer]  

## 2020-01-06 NOTE — Assessment & Plan Note (Signed)
I have recommended scrotal support. No testicular masses, no epididymal pain or masses. Simple varicocele, I did explain to him that this could certainly cause right groin and hip pain, radiating to his back as well.
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
## 2020-01-29 ENCOUNTER — Other Ambulatory Visit: Payer: Self-pay

## 2020-01-29 ENCOUNTER — Ambulatory Visit: Payer: Self-pay

## 2020-01-29 ENCOUNTER — Ambulatory Visit (INDEPENDENT_AMBULATORY_CARE_PROVIDER_SITE_OTHER): Payer: BLUE CROSS/BLUE SHIELD | Admitting: Sports Medicine

## 2020-01-29 DIAGNOSIS — M533 Sacrococcygeal disorders, not elsewhere classified: Secondary | ICD-10-CM | POA: Diagnosis not present

## 2020-01-29 NOTE — Progress Notes (Signed)
    Procedures performed today:    Procedure: Real-time Ultrasound Guided injection of the right sacroiliac joint Device: Samsung HS60  Verbal informed consent obtained.  Time-out conducted.  Noted no overlying erythema, induration, or other signs of local infection.  Skin prepped in a sterile fashion.  Local anesthesia: Topical Ethyl chloride.  With sterile technique and under real time ultrasound guidance: 1 cc Kenalog 40, 2 cc lidocaine, 2 cc bupivacaine injected easily Completed without difficulty  Advised to call if fevers/chills, erythema, induration, drainage, or persistent bleeding.  Images permanently stored and available for review in PACS.  Impression: Technically successful ultrasound guided injection.  Independent interpretation of notes and tests performed by another provider:   None.  Brief History, Exam, Impression, and Recommendations:    Pain of right sacroiliac joint Juan Rojas also has pain in his right sacroiliac joint. Hip joint pain is still present but improved after injection for the arthrogram. This was injected today for diagnostic and therapeutic purposes, rehab exercises given. He did have good immediate relief. If insufficient long-term relief we can consider further intervention and evaluation of his lumbar spine, he is post microdiscectomy. The other option would be blocks and ablation.    ___________________________________________ Juan Rojas. Benjamin Stain, M.D., ABFM., CAQSM. Primary Care and Sports Medicine [facility]  Adjunct Instructor of Family Medicine  [employer]

## 2020-01-29 NOTE — Assessment & Plan Note (Signed)
Juan Rojas also has pain in his right sacroiliac joint. Hip joint pain is still present but improved after injection for the arthrogram. This was injected today for diagnostic and therapeutic purposes, rehab exercises given. He did have good immediate relief. If insufficient long-term relief we can consider further intervention and evaluation of his lumbar spine, he is post microdiscectomy. The other option would be blocks and ablation.

## 2020-02-03 ENCOUNTER — Ambulatory Visit: Payer: BLUE CROSS/BLUE SHIELD | Admitting: Sports Medicine

## 2020-02-29 ENCOUNTER — Ambulatory Visit: Payer: BLUE CROSS/BLUE SHIELD | Admitting: Sports Medicine

## 2021-01-27 IMAGING — MR MR HIP*R* W/CM
6 series · 40 of 40 positions shown · IV contrast (agent unspecified)
Comparison: X-ray 11/18/2019

CLINICAL DATA: Chronic right hip pain

EXAM:
MRI OF THE RIGHT HIP WITH CONTRAST (MR Arthrogram)
TECHNIQUE: Multiplanar, multisequence MR imaging of the hip was performed
immediately following contrast injection into the hip joint under
fluoroscopic guidance. No intravenous contrast was administered.

[Series 5: T2 fat-sat · coronal · 4.0mm · 1.41mm/px · 9 of 41 slices shown]
[im 1/41]
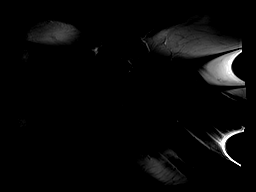
[im 6/41]
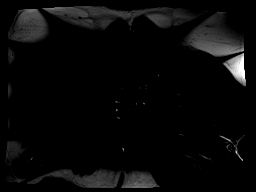
[im 11/41]
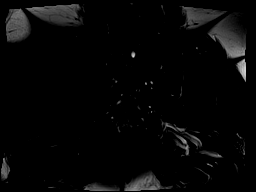
[im 16/41]
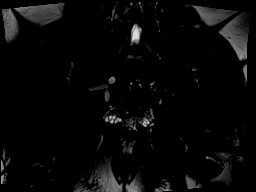
[im 21/41]
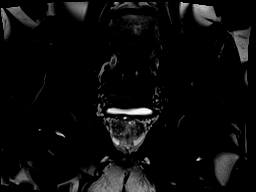
[im 26/41]
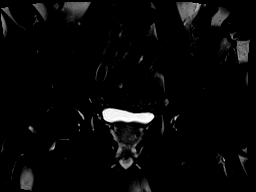
[im 31/41]
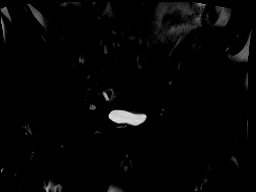
[im 36/41]
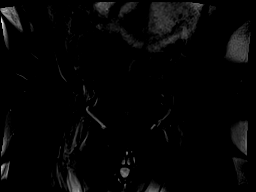
[im 41/41]
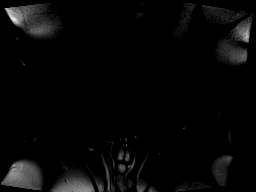

[Series 6: STIR · coronal · 4.0mm · 1.41mm/px · 8 of 41 slices shown]
[im 1/41]
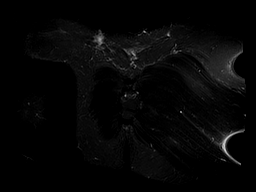
[im 6/41]
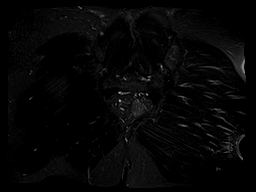
[im 12/41]
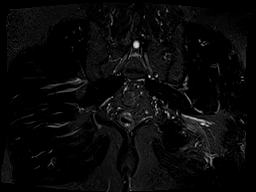
[im 18/41]
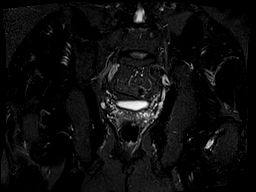
[im 23/41]
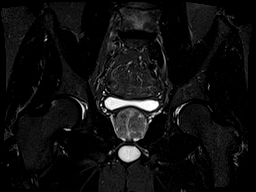
[im 29/41]
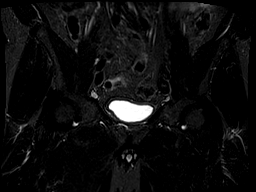
[im 35/41]
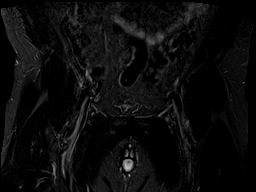
[im 41/41]
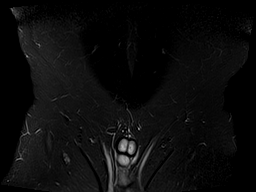

[Series 7: T1 · coronal · 4.0mm · 1.33mm/px · 8 of 41 slices shown]
[im 1/41]
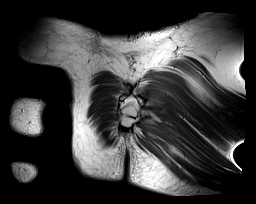
[im 6/41]
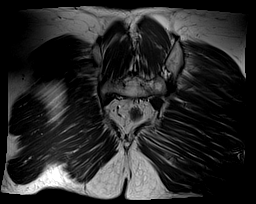
[im 12/41]
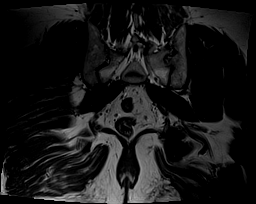
[im 18/41]
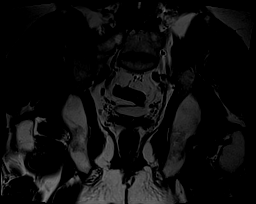
[im 23/41]
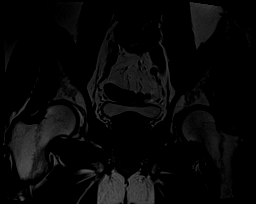
[im 29/41]
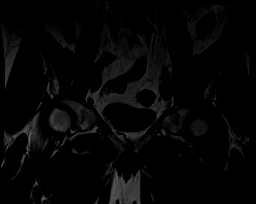
[im 35/41]
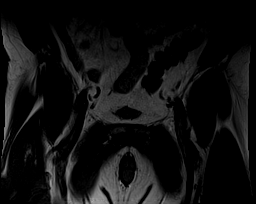
[im 41/41]
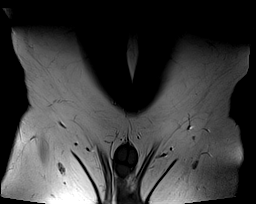

[Series 12: T1 fat-sat · coronal · 4.0mm · 0.70mm/px · 5 of 25 slices shown (1 of 3)]
[im 1/25]
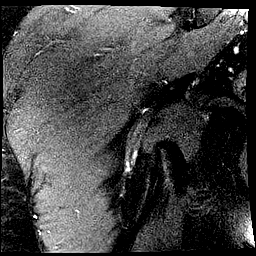
[im 7/25]
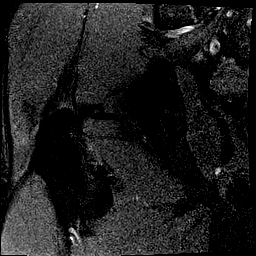
[im 13/25]
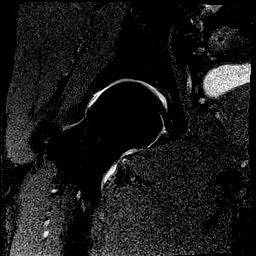
[im 19/25]
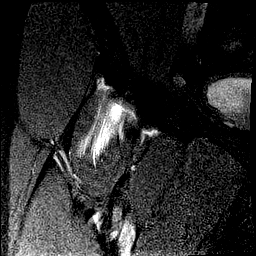
[im 25/25]
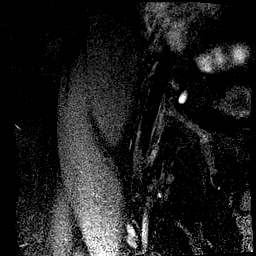

[Series 13: T1 fat-sat · sagittal · 4.0mm · 0.70mm/px · 6 of 28 slices shown (2 of 3)]
[im 1/28]
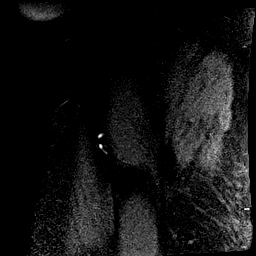
[im 6/28]
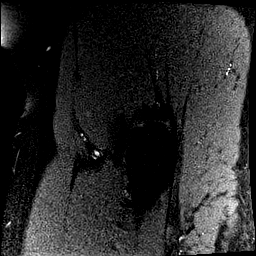
[im 11/28]
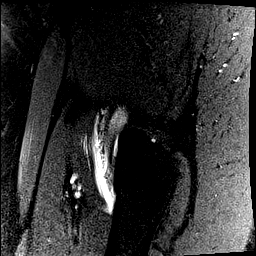
[im 17/28]
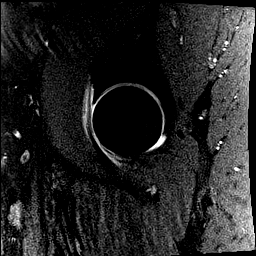
[im 22/28]
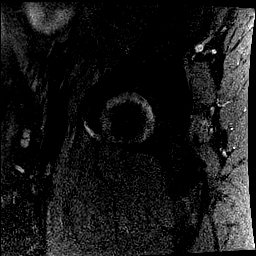
[im 28/28]
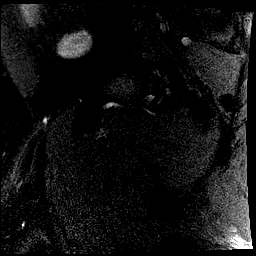

[Series 14: T1 fat-sat · oblique · 4.0mm · 0.70mm/px · 4 of 20 slices shown (3 of 3)]
[im 1/20]
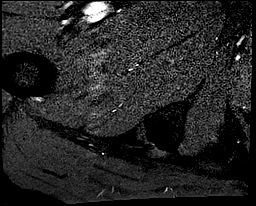
[im 7/20]
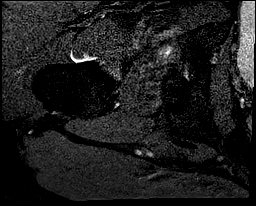
[im 13/20]
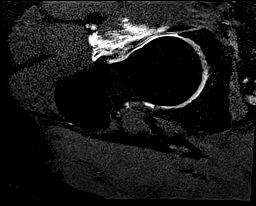
[im 20/20]
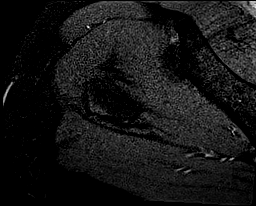

[40 of 40 positions shown; findings below may reference images not displayed]

FINDINGS: Bones: No acute fracture. No dislocation. No femoral head avascular
necrosis. Mildly decreased offset of the lateral femoral head-neck
junction with small bony protuberance. No subcortical marrow edema
at the superolateral aspect of the femoral head without cystic
change or synovial herniation pit. There is acetabular over
coverage. No bone marrow edema within the bony pelvis. Suspected
transitional anatomy at the lumbosacral junction. There is
degenerative disc disease with prominent discogenic endplate marrow
changes within the visualized lower lumbar spine. Bilateral SI
joints and pubic symphysis appear intact and unremarkable.

Articular cartilage and labrum

Articular cartilage: Mild chondral thinning of the superior aspect
of the hip joint.

Labrum: Irregularity/fraying of the superior labrum. No paralabral
cyst.

Joint or bursal effusion

Joint effusion: Joint is adequately distended with injected
contrast. There is extracapsular contrast anteriorly, likely
injection related.

Bursae: No focal periarticular fluid collection.

Muscles and tendons

Muscles and tendons: The gluteal, hamstring, iliopsoas, rectus
femoris, and adductor tendons appear intact without tear or
significant tendinosis. Unremarkable muscle bulk and signal
intensity.

Other findings

Miscellaneous: The visualized internal pelvic contents appear
unremarkable.
IMPRESSION: 1. Mild degenerative changes of the right hip with superior
acetabular fraying/irregularity.
2. Osseous morphology which can be seen in the setting of mixed-type
femoroacetabular impingement.
3. Degenerative disc disease with prominent discogenic endplate
marrow changes within the visualized lower lumbar spine.
